# Patient Record
Sex: Male | Born: 2000 | Hispanic: Yes | Marital: Single | Smoking: Never smoker
Health system: Southern US, Community
[De-identification: ages and names within clinical notes are randomized; demographics above are authoritative.]

## PROBLEM LIST (undated history)

## (undated) DIAGNOSIS — E119 Type 2 diabetes mellitus without complications: Secondary | ICD-10-CM

---

## 2020-08-08 ENCOUNTER — Encounter (HOSPITAL_COMMUNITY): Payer: Self-pay

## 2020-08-08 ENCOUNTER — Emergency Department (HOSPITAL_COMMUNITY): Payer: Self-pay

## 2020-08-08 ENCOUNTER — Encounter (HOSPITAL_COMMUNITY): Payer: Self-pay | Admitting: Emergency Medicine

## 2020-08-08 ENCOUNTER — Other Ambulatory Visit: Payer: Self-pay

## 2020-08-08 ENCOUNTER — Ambulatory Visit (HOSPITAL_COMMUNITY)
Admission: EM | Admit: 2020-08-08 | Discharge: 2020-08-08 | Disposition: A | Discharge status: Home / Self Care | Payer: Self-pay | Attending: Emergency Medicine | Admitting: Emergency Medicine

## 2020-08-08 ENCOUNTER — Emergency Department (HOSPITAL_COMMUNITY)
Admission: EM | Admit: 2020-08-08 | Discharge: 2020-08-09 | Disposition: A | Discharge status: Home / Self Care | Payer: Self-pay | Attending: Emergency Medicine | Admitting: Emergency Medicine

## 2020-08-08 DIAGNOSIS — M25561 Pain in right knee: Secondary | ICD-10-CM

## 2020-08-08 DIAGNOSIS — E119 Type 2 diabetes mellitus without complications: Secondary | ICD-10-CM | POA: Insufficient documentation

## 2020-08-08 DIAGNOSIS — L03115 Cellulitis of right lower limb: Secondary | ICD-10-CM | POA: Insufficient documentation

## 2020-08-08 HISTORY — DX: Type 2 diabetes mellitus without complications: E11.9

## 2020-08-08 LAB — CBC WITH DIFFERENTIAL/PLATELET
Abs Immature Granulocytes: 0.02 10*3/uL (ref 0.00–0.07)
Basophils Absolute: 0 10*3/uL (ref 0.0–0.1)
Basophils Relative: 0 %
Eosinophils Absolute: 0.2 10*3/uL (ref 0.0–0.5)
Eosinophils Relative: 2 %
HCT: 40.7 % (ref 39.0–52.0)
Hemoglobin: 14.1 g/dL (ref 13.0–17.0)
Immature Granulocytes: 0 %
Lymphocytes Relative: 21 %
Lymphs Abs: 2.1 10*3/uL (ref 0.7–4.0)
MCH: 29.7 pg (ref 26.0–34.0)
MCHC: 34.6 g/dL (ref 30.0–36.0)
MCV: 85.9 fL (ref 80.0–100.0)
Monocytes Absolute: 0.9 10*3/uL (ref 0.1–1.0)
Monocytes Relative: 9 %
Neutro Abs: 6.9 10*3/uL (ref 1.7–7.7)
Neutrophils Relative %: 68 %
Platelets: 188 10*3/uL (ref 150–400)
RBC: 4.74 MIL/uL (ref 4.22–5.81)
RDW: 12.2 % (ref 11.5–15.5)
WBC: 10.2 10*3/uL (ref 4.0–10.5)
nRBC: 0 % (ref 0.0–0.2)

## 2020-08-08 MED ORDER — KETOROLAC TROMETHAMINE 30 MG/ML IJ SOLN
INTRAMUSCULAR | Status: AC
  Filled 2020-08-08: qty 1

## 2020-08-08 MED ORDER — KETOROLAC TROMETHAMINE 30 MG/ML IJ SOLN
30.0000 mg | Freq: Once | INTRAMUSCULAR | Status: AC
  Administered 2020-08-08: 30 mg via INTRAMUSCULAR

## 2020-08-08 MED ORDER — ACETAMINOPHEN 325 MG PO TABS
650.0000 mg | ORAL_TABLET | Freq: Once | ORAL | Status: AC
  Administered 2020-08-08: 650 mg via ORAL

## 2020-08-08 MED ORDER — ACETAMINOPHEN 325 MG PO TABS
ORAL_TABLET | ORAL | Status: AC
  Filled 2020-08-08: qty 2

## 2020-08-08 NOTE — ED Provider Notes (Signed)
[facility]    CSN: [id number] Arrival date & time: 08/08/20  1648      History   Chief Complaint Chief Complaint  Patient presents with  . Knee Pain    HPI Steven Sawyer is a 20 y.o. male. Medical Translator utilized  HPI  Knee pain: Pt has had left knee pain for the past 1.5 days. No known injury. Has has associated chills and fever. Overall he is not feeling well. He has not taken anything for symptoms. No cuts or skin breakdown and no history of procedures to the knee. No chest pain, SOB or calf pain   Past Medical History:  Diagnosis Date  . Diabetes mellitus without complication (HCC)     There are no problems to display for this patient.   History reviewed. No pertinent surgical history.     Home Medications    Prior to Admission medications   Not on File    Family History No family history on file.  Social History Social History   Tobacco Use  . Smoking status: Never Smoker  Substance Use Topics  . Alcohol use: Yes    Comment: rare  . Drug use: Never     Allergies   Patient has no known allergies.   Review of Systems Review of Systems  As stated above in HPI Physical Exam Triage Vital Signs ED Triage Vitals  Enc Vitals Group     BP 08/08/20 1735 124/68     Pulse Rate 08/08/20 1735 (!) 103     Resp 08/08/20 1735 20     Temp 08/08/20 1735 (!) 100.8 F (38.2 C)     Temp Source 08/08/20 1735 Oral     SpO2 08/08/20 1735 99 %     Weight --      Height --      Head Circumference --      Peak Flow --      Pain Score 08/08/20 1728 7     Pain Loc --      Pain Edu? --      Excl. in GC? --    No data found.  Updated Vital Signs BP 124/68 (BP Location: Left Arm)   Pulse (!) 103   Temp (!) 100.8 F (38.2 C) (Oral)   Resp 20   SpO2 99%   Physical Exam Vitals and nursing note reviewed.  Constitutional:      General: He is not in acute distress.    Appearance: Normal appearance. He is not ill-appearing,  toxic-appearing or diaphoretic.  Cardiovascular:     Rate and Rhythm: Normal rate and regular rhythm.     Heart sounds: Normal heart sounds.  Pulmonary:     Effort: Pulmonary effort is normal.     Breath sounds: Normal breath sounds.  Musculoskeletal:     Comments: Moderate edema and mild erythema of the right knee. Tenderness to palpation throughout and ROM reduction of about 50% throughout. Increased warmth of the knee. No sign of skin breakdown or joint instability.   Neurological:     Mental Status: He is alert and oriented to person, place, and time.      UC Treatments / Results  Labs (all labs ordered are listed, but only abnormal results are displayed) Labs Reviewed - No data to display  EKG   Radiology No results found.  Procedures Procedures (including critical care time)  Medications Ordered in UC Medications  ketorolac (TORADOL) 30 MG/ML injection 30 mg (has no  administration in time range)  acetaminophen (TYLENOL) tablet 650 mg (650 mg Oral Given 08/08/20 1743)    Initial Impression / Assessment and Plan / UC Course  I have reviewed the triage vital signs and the nursing notes.  Pertinent labs & imaging results that were available during my care of the patient were reviewed by me and considered in my medical decision making (see chart for details).     New. Concern for septic arthritis which I discussed with patient. He is agreeable to a Toradol injection prior to his departure to help with pain and fever reduction while he waits in the ER. He has a friend who will be transporting him directly to the emergency room.   Final Clinical Impressions(s) / UC Diagnoses   Final diagnoses:  Acute pain of right knee     Discharge Instructions     Please go directly to the emergency room     ED Prescriptions    None     PDMP not reviewed this encounter.   Rushie Chestnut, [place] 08/08/20 1824

## 2020-08-08 NOTE — ED Notes (Signed)
Patient is being discharged from the Urgent Care and sent to the Emergency Department via POV . Per Ellwood Handler, [place], patient is in need of higher level of care due to septic joint. Patient is aware and verbalizes understanding of plan of care.  Vitals:   08/08/20 1735  BP: 124/68  Pulse: (!) 103  Resp: 20  Temp: (!) 100.8 F (38.2 C)  SpO2: 99%

## 2020-08-08 NOTE — ED Triage Notes (Signed)
Pt sent from UC for knee pain on the R side, denies injury. Told he had fluid on his knee possibly

## 2020-08-08 NOTE — ED Notes (Signed)
EDP performing [place], will obtain VS once complete.

## 2020-08-08 NOTE — Discharge Instructions (Addendum)
Please go directly to the emergency room 

## 2020-08-08 NOTE — ED Provider Notes (Signed)
[facility] EMERGENCY DEPARTMENT Provider Note   CSN: [id number] Arrival date & time: 08/08/20  1850     History Chief Complaint  Patient presents with  . Knee Pain    Steven Sawyer is a 20 y.o. male.  20 year old male presents to the emergency department for evaluation of right knee pain.  He has been experiencing pain for the past 1.5 days.  Pain has been constant and is worse with bending his knee.  He did try some Tylenol for the pain which helped a little.  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  He has not had any prior issues with his right knee in the past.  Denies any known fevers or associated congestion, cough, shortness of breath, nausea, vomiting, diarrhea, chest pain.  No associated numbness or paresthesias to the extremity.  Denies use of IV drugs as well as any history of STDs.  Reports consistent use of protection when sexually active.  The history is provided by the patient. A language interpreter was used ([employer]).  Knee Pain      Past Medical History:  Diagnosis Date  . Diabetes mellitus without complication (HCC)     There are no problems to display for this patient.   History reviewed. No pertinent surgical history.     No family history on file.  Social History   Tobacco Use  . Smoking status: Never Smoker  Substance Use Topics  . Alcohol use: Yes    Comment: rare  . Drug use: Never    Home Medications Prior to Admission medications   Not on File    Allergies    Patient has no known allergies.  Review of Systems   Review of Systems  Ten systems reviewed and are negative for acute change, except as noted in the HPI.    Physical Exam Updated Vital Signs BP 115/79 (BP Location: Right Arm)   Pulse 88   Temp 99.1 F (37.3 C) (Oral)   Resp 18   SpO2 99%   Physical Exam Vitals and  nursing note reviewed.  Constitutional:      General: He is not in acute distress.    Appearance: He is well-developed and well-nourished. He is not diaphoretic.     Comments: Nontoxic appearing and in NAD  HENT:     Head: Normocephalic and atraumatic.  Eyes:     General: No scleral icterus.    Extraocular Movements: EOM normal.     Conjunctiva/sclera: Conjunctivae normal.  Cardiovascular:     Rate and Rhythm: Normal rate and regular rhythm.     Pulses: Normal pulses.     Comments: DP pulse 2+ in the right lower extremity Pulmonary:     Effort: Pulmonary effort is normal. No respiratory distress.     Comments: Respirations even and unlabored Musculoskeletal:        General: Tenderness (lateral aspect of R knee) present.     Cervical back: Normal range of motion.     Comments: Patient with full right knee extension as well as flexion to 90 degrees.  There is no crepitus or deformity of the right knee.  Mild overlying erythema of the skin without significant heat to touch.  No lymphangitic streaking.  Mild joint inflammation without palpable effusion.  Skin:    General: Skin is warm and dry.  Coloration: Skin is not pale.     Findings: No erythema or rash.  Neurological:     Mental Status: He is alert and oriented to person, place, and time.     Coordination: Coordination normal.     Comments: Sensation to light touch intact in the RLE  Psychiatric:        Mood and Affect: Mood and affect normal.        Behavior: Behavior normal.     ED Results / Procedures / Treatments   Labs (all labs ordered are listed, but only abnormal results are displayed) Labs Reviewed  CBC WITH DIFFERENTIAL/PLATELET  C-REACTIVE PROTEIN  SEDIMENTATION RATE    EKG None  Radiology DG Knee Complete 4 Views Right  Result Date: 08/08/2020 CLINICAL DATA:  Concern for joint effusion EXAM: RIGHT KNEE - COMPLETE 4+ VIEW COMPARISON:  None. FINDINGS: No fracture of the proximal tibia or distal femur.  Patella is normal. No joint effusion. IMPRESSION: Normal knee radiograph. Electronically Signed   By: Genevive Bi M.D.   On: 08/08/2020 20:14    Procedures Procedures   ULTRASOUND LIMITED SOFT TISSUE/ MUSCULOSKELETAL: R KNEE Indication: R knee pain Linear probe used to evaluate area of interest in two planes. Findings:  Cobblestoning of the soft tissue. No joint effusion. Performed by: myself, Dr. Silverio Lay Images saved electronically   Medications Ordered in ED Medications - No data to display  ED Course  I have reviewed the triage vital signs and the nursing notes.  Pertinent labs & imaging results that were available during my care of the patient were reviewed by me and considered in my medical decision making (see chart for details).  Clinical Course as of 08/10/20 0112  Sat Aug 08, 2020  2259 [place] completed at bedside without evidence of joint effusion. Some cobblestoning noted to subcutaneous tissue, suggestive of cellulitis. Doubt septic joint. [KH]  2339 Reassuring CBC without leukocytosis or left shift. CRP pending. [KH]  Sun Aug 09, 2020  0019 CRP elevation favored to reflect visual cellulitic changes. Will start on abx and refer to Orthopedics for close follow up. 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  He is neurovascularly intact, ambulatory.  No significant joint effusion.  No crepitus, deformity.  He underwent x-ray which is negative for acute bony abnormality.  Bedside ultrasound also completed without evidence of significant joint effusion.  There is some cobblestoning of the soft tissue suggestive of cellulitic process.  This is likely contributing to a degree of his pain as well as appearance of minimal swelling.  He will be started on antibiotics and  given referral for follow-up with orthopedics.  Patient instructed to return to the emergency department for new or concerning symptoms or if he develops worsening redness, pain with joint movement, swelling.  Discharged in stable condition with no unaddressed concerns.   Final Clinical Impression(s) / ED Diagnoses Final diagnoses:  Cellulitis of right knee    Rx / DC Orders ED Discharge Orders    None       Antony Madura, PA-C 08/10/20 0115    Charlynne Pander, MD 08/12/20 1316

## 2020-08-08 NOTE — ED Triage Notes (Signed)
Right knee pain since yesterday, no known injury.  Right knee is painful and swollen

## 2020-08-09 LAB — SEDIMENTATION RATE: Sed Rate: 26 mm/hr — ABNORMAL HIGH (ref 0–16)

## 2020-08-09 LAB — C-REACTIVE PROTEIN: CRP: 12 mg/dL — ABNORMAL HIGH (ref <1.0)

## 2020-08-09 MED ORDER — DOXYCYCLINE HYCLATE 100 MG PO CAPS: 100.0000 mg | ORAL_CAPSULE | Freq: Two times a day (BID) | ORAL | 0 refills | Status: AC

## 2020-08-09 MED ORDER — NAPROXEN 500 MG PO TABS: 500.0000 mg | ORAL_TABLET | Freq: Two times a day (BID) | ORAL | 0 refills | Status: AC

## 2020-08-09 NOTE — Discharge Instructions (Signed)
Take doxycycline as prescribed until finished.  We recommend close follow-up with an orthopedic specialist to ensure that your pain improves.  Call on Monday morning to schedule this follow-up visit.  You have been provided the office number below.  You have been prescribed naproxen to use for management of your persistent pain.  Ice your knee 3-4 times per day to limit swelling/inflammation.  Return to the emergency department if symptoms worsen.  Tome la doxiciclina segn lo prescrito hasta que termine. Recomendamos un seguimiento cercano con un especialista en ortopedia para asegurarse de que su dolor mejore. Llame el lunes por la maana para programar esta visita de seguimiento. Se le ha proporcionado el nmero de oficina a continuacin. Le han recetado naproxeno para que lo use para [employer]. Aplique hielo en la rodilla 3 o 4 veces al da para limitar la hinchazn/inflamacin. Regrese al departamento de emergencias si los sntomas empeoran.

## 2022-10-31 IMAGING — DX DG KNEE COMPLETE 4+V*R*
4 series · 4 of 4 positions shown · non-contrast
Comparison: None.

CLINICAL DATA: Concern for joint effusion

EXAM:
RIGHT KNEE - COMPLETE 4+ VIEW

[knee ap]
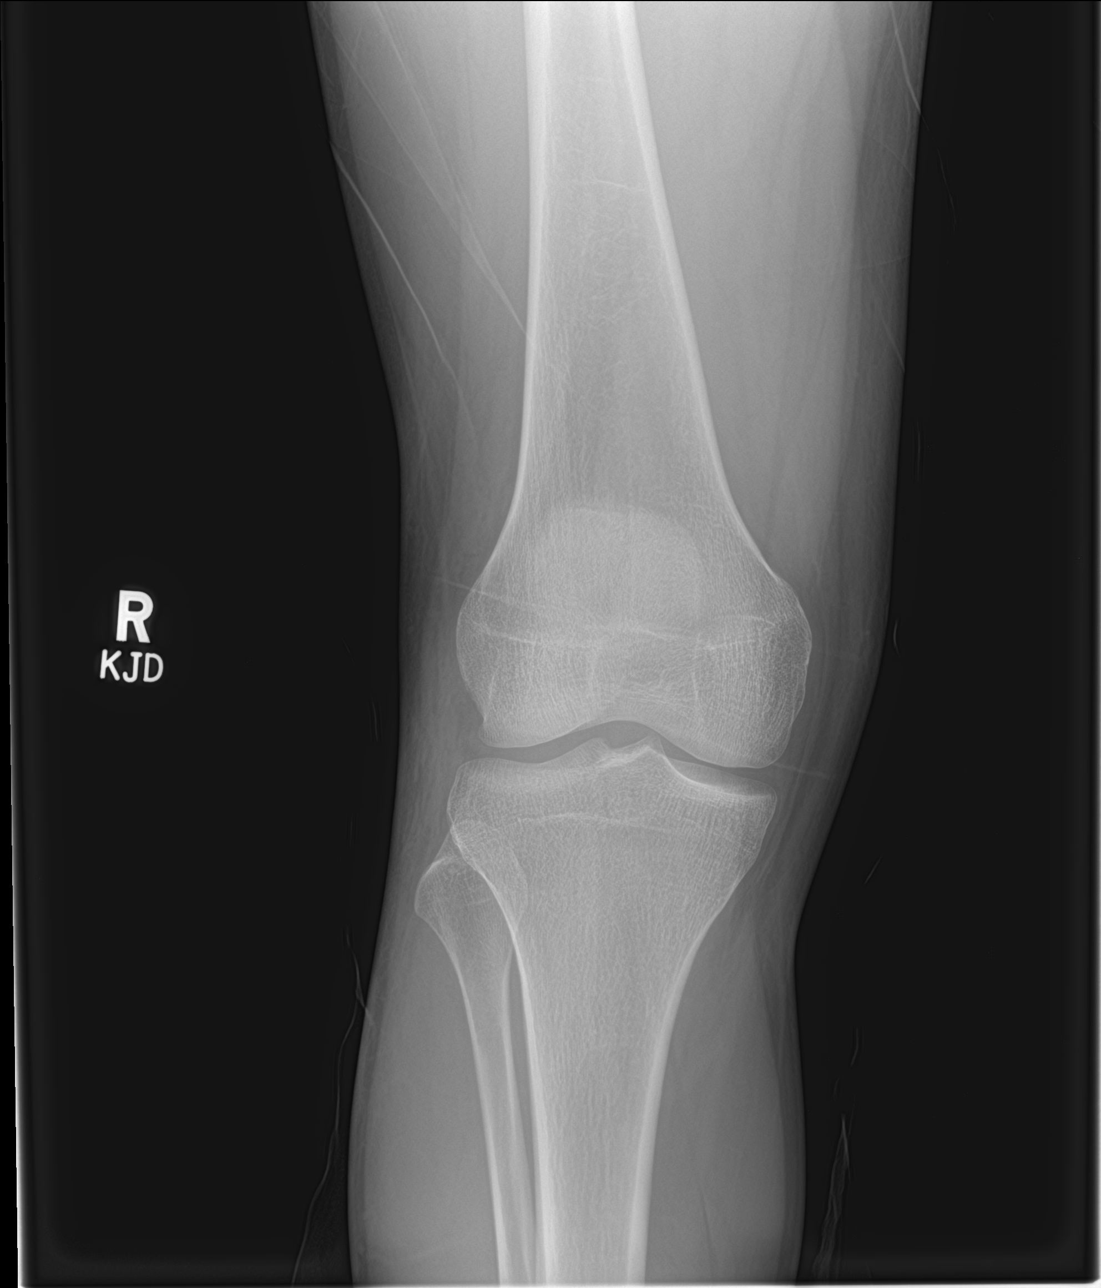

[knee lat]
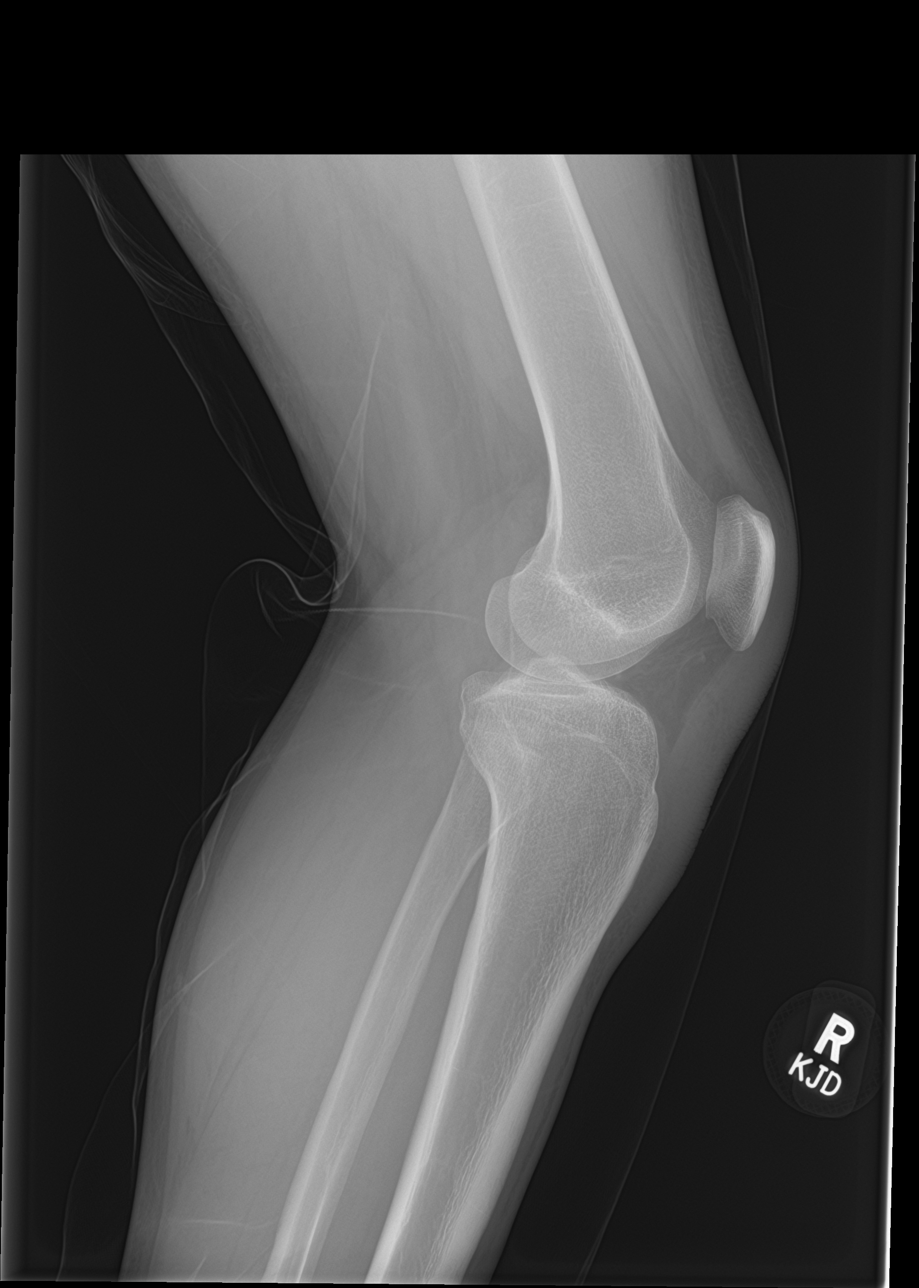

[knee obl (1 of 2)]
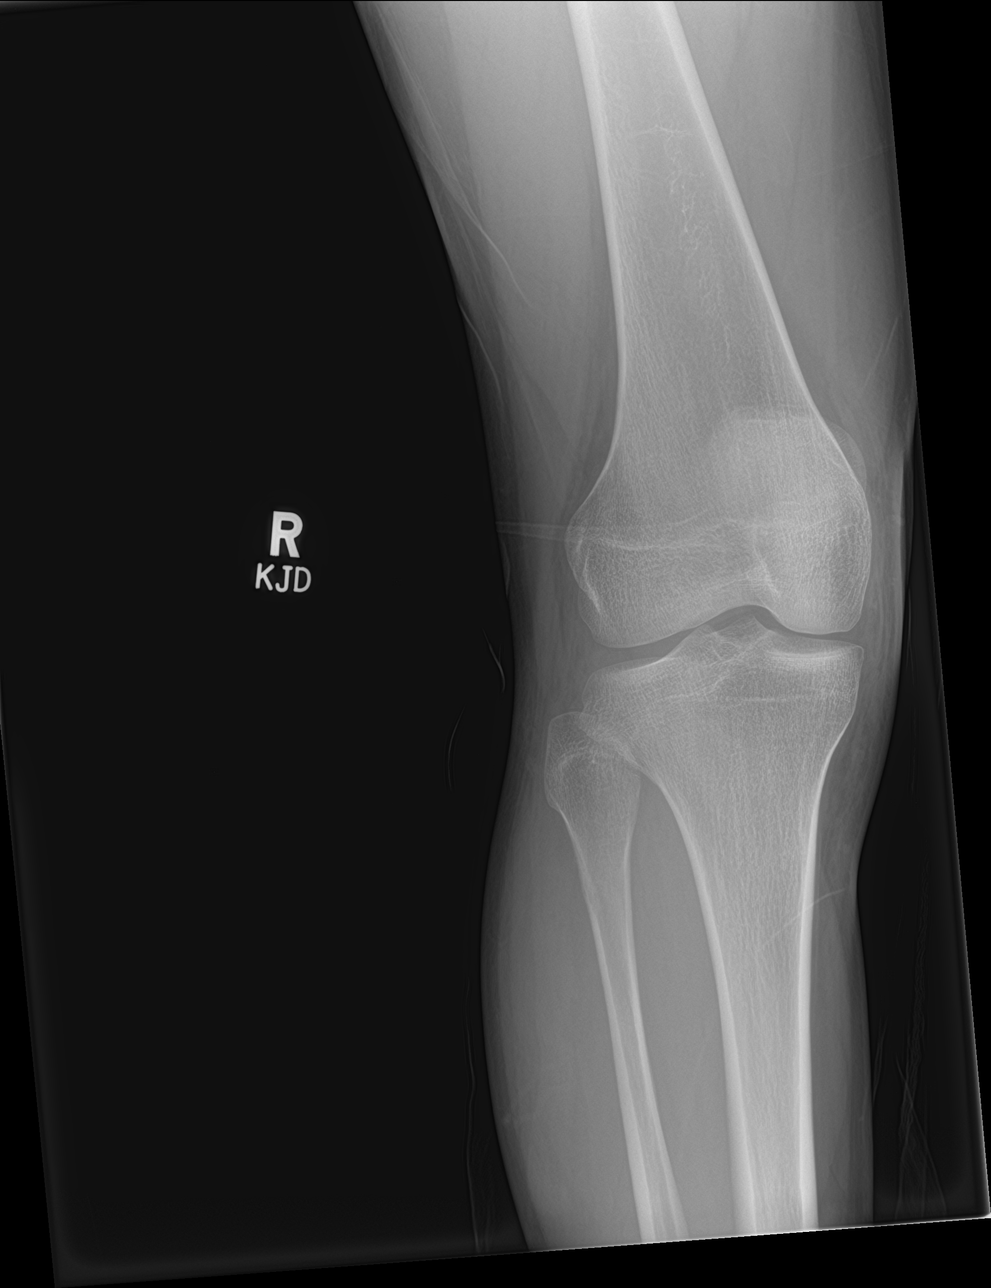

[knee obl (2 of 2)]
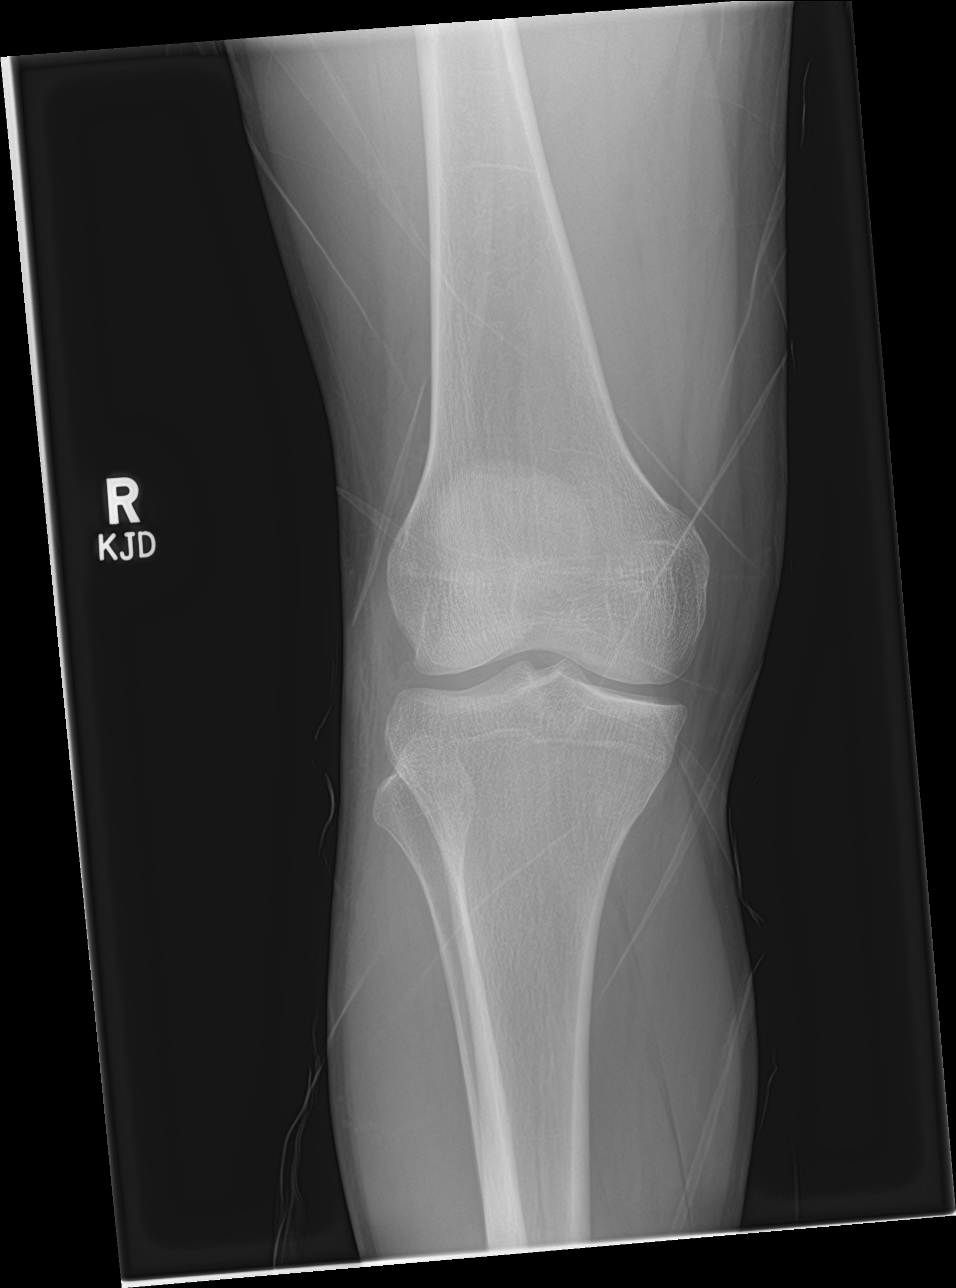

[4 of 4 positions shown; findings below may reference images not displayed]

FINDINGS: No fracture of the proximal tibia or distal femur. Patella is
normal. No joint effusion.
IMPRESSION: Normal knee radiograph.
